# Patient Record
Sex: Female | Born: 1965 | Race: White | Marital: Married | Smoking: Current every day smoker
Health system: Southern US, Community
[De-identification: ages and names within clinical notes are randomized; demographics above are authoritative.]

---

## 2014-03-04 ENCOUNTER — Other Ambulatory Visit: Payer: Self-pay | Admitting: Neurosurgery

## 2014-03-04 DIAGNOSIS — M4802 Spinal stenosis, cervical region: Secondary | ICD-10-CM

## 2014-03-06 ENCOUNTER — Ambulatory Visit
Admission: RE | Admit: 2014-03-06 | Discharge: 2014-03-06 | Disposition: A | Discharge status: Home / Self Care | Payer: Managed Care, Other (non HMO) | Source: Ambulatory Visit | Attending: Neurosurgery | Admitting: Neurosurgery

## 2014-03-06 VITALS — BP 113/64 | HR 66

## 2014-03-06 DIAGNOSIS — M5090 Cervical disc disorder, unspecified, unspecified cervical region: Secondary | ICD-10-CM

## 2014-03-06 DIAGNOSIS — M4802 Spinal stenosis, cervical region: Secondary | ICD-10-CM

## 2014-03-06 MED ORDER — METHYLPREDNISOLONE ACETATE 40 MG/ML INJ SUSP (RADIOLOG
120.0000 mg | Freq: Once | INTRAMUSCULAR | Status: AC
  Administered 2014-03-06: 120 mg via EPIDURAL

## 2014-03-06 MED ORDER — IOHEXOL 300 MG/ML  SOLN
1.0000 mL | Freq: Once | INTRAMUSCULAR | Status: AC | PRN
  Administered 2014-03-06: 1 mL via EPIDURAL

## 2014-03-06 NOTE — Discharge Instructions (Signed)

## 2014-04-09 ENCOUNTER — Other Ambulatory Visit: Payer: Self-pay | Admitting: Neurosurgery

## 2014-04-09 DIAGNOSIS — M5412 Radiculopathy, cervical region: Secondary | ICD-10-CM

## 2014-04-10 ENCOUNTER — Ambulatory Visit
Admission: RE | Admit: 2014-04-10 | Discharge: 2014-04-10 | Disposition: A | Discharge status: Home / Self Care | Payer: Managed Care, Other (non HMO) | Source: Ambulatory Visit | Attending: Neurosurgery | Admitting: Neurosurgery

## 2014-04-10 VITALS — BP 115/57 | HR 75

## 2014-04-10 DIAGNOSIS — M5412 Radiculopathy, cervical region: Secondary | ICD-10-CM

## 2014-04-10 DIAGNOSIS — M5090 Cervical disc disorder, unspecified, unspecified cervical region: Secondary | ICD-10-CM

## 2014-04-10 MED ORDER — IOHEXOL 300 MG/ML  SOLN: 1.0000 mL | Freq: Once | INTRAMUSCULAR | Status: AC | PRN

## 2014-04-10 MED ORDER — TRIAMCINOLONE ACETONIDE 40 MG/ML IJ SUSP (RADIOLOGY)
60.0000 mg | Freq: Once | INTRAMUSCULAR | Status: AC
  Administered 2014-04-10: 60 mg via EPIDURAL

## 2023-03-03 ENCOUNTER — Other Ambulatory Visit: Payer: Self-pay | Admitting: Orthopedic Surgery

## 2023-03-03 DIAGNOSIS — M50122 Cervical disc disorder at C5-C6 level with radiculopathy: Secondary | ICD-10-CM

## 2023-03-03 DIAGNOSIS — M4722 Other spondylosis with radiculopathy, cervical region: Secondary | ICD-10-CM

## 2023-03-03 DIAGNOSIS — M4802 Spinal stenosis, cervical region: Secondary | ICD-10-CM

## 2023-03-03 DIAGNOSIS — M50123 Cervical disc disorder at C6-C7 level with radiculopathy: Secondary | ICD-10-CM

## 2023-03-17 ENCOUNTER — Encounter: Payer: Self-pay | Admitting: Orthopedic Surgery

## 2023-03-21 ENCOUNTER — Ambulatory Visit
Admission: RE | Admit: 2023-03-21 | Discharge: 2023-03-21 | Disposition: A | Discharge status: Home / Self Care | Payer: Managed Care, Other (non HMO) | Source: Ambulatory Visit | Attending: Orthopedic Surgery | Admitting: Orthopedic Surgery

## 2023-03-21 DIAGNOSIS — M4722 Other spondylosis with radiculopathy, cervical region: Secondary | ICD-10-CM

## 2023-03-21 DIAGNOSIS — M50123 Cervical disc disorder at C6-C7 level with radiculopathy: Secondary | ICD-10-CM

## 2023-03-21 DIAGNOSIS — M50122 Cervical disc disorder at C5-C6 level with radiculopathy: Secondary | ICD-10-CM

## 2023-03-21 DIAGNOSIS — M4802 Spinal stenosis, cervical region: Secondary | ICD-10-CM
